# Patient Record
Sex: Female | Born: 1995 | Race: Black or African American | Hispanic: No | Marital: Single | Smoking: Never smoker
Health system: Southern US, Community
[De-identification: ages and names within clinical notes are randomized; demographics above are authoritative.]

---

## 2014-05-05 ENCOUNTER — Emergency Department (HOSPITAL_BASED_OUTPATIENT_CLINIC_OR_DEPARTMENT_OTHER): Payer: 59

## 2014-05-05 ENCOUNTER — Encounter (HOSPITAL_BASED_OUTPATIENT_CLINIC_OR_DEPARTMENT_OTHER): Payer: Self-pay | Admitting: Emergency Medicine

## 2014-05-05 ENCOUNTER — Emergency Department (HOSPITAL_BASED_OUTPATIENT_CLINIC_OR_DEPARTMENT_OTHER)
Admission: EM | Admit: 2014-05-05 | Discharge: 2014-05-06 | Disposition: A | Discharge status: Home / Self Care | Payer: 59 | Attending: Emergency Medicine | Admitting: Emergency Medicine

## 2014-05-05 DIAGNOSIS — K1370 Unspecified lesions of oral mucosa: Secondary | ICD-10-CM | POA: Diagnosis present

## 2014-05-05 DIAGNOSIS — J029 Acute pharyngitis, unspecified: Secondary | ICD-10-CM | POA: Insufficient documentation

## 2014-05-05 DIAGNOSIS — J069 Acute upper respiratory infection, unspecified: Secondary | ICD-10-CM | POA: Insufficient documentation

## 2014-05-05 LAB — RAPID STREP SCREEN (MED CTR MEBANE ONLY): Streptococcus, Group A Screen (Direct): NEGATIVE

## 2014-05-05 NOTE — ED Notes (Signed)
Pt presets to ED with complaints of throat pain and swelling for 2 days. NAD

## 2014-05-06 ENCOUNTER — Encounter (HOSPITAL_BASED_OUTPATIENT_CLINIC_OR_DEPARTMENT_OTHER): Payer: Self-pay | Admitting: Emergency Medicine

## 2014-05-06 MED ORDER — FLUTICASONE PROPIONATE 50 MCG/ACT NA SUSP: 2.0000 | Freq: Every day | NASAL | Status: AC

## 2014-05-06 MED ORDER — LIDOCAINE VISCOUS 2 % MT SOLN
15.0000 mL | Freq: Once | OROMUCOSAL | Status: AC
  Administered 2014-05-06: 15 mL via OROMUCOSAL
  Filled 2014-05-06: qty 15

## 2014-05-06 MED ORDER — IBUPROFEN 400 MG PO TABS
400.0000 mg | ORAL_TABLET | Freq: Once | ORAL | Status: AC
  Administered 2014-05-06: 400 mg via ORAL
  Filled 2014-05-06: qty 1

## 2014-05-06 MED ORDER — NAPROXEN 375 MG PO TABS: 375.0000 mg | ORAL_TABLET | Freq: Two times a day (BID) | ORAL | Status: AC

## 2014-05-06 MED ORDER — LORATADINE 10 MG PO TABS
10.0000 mg | ORAL_TABLET | Freq: Once | ORAL | Status: AC
  Administered 2014-05-06: 10 mg via ORAL
  Filled 2014-05-06: qty 1

## 2014-05-06 NOTE — Discharge Instructions (Signed)
Cool Mist Vaporizers °Vaporizers may help relieve the symptoms of a cough and cold. They add moisture to the air, which helps mucus to become thinner and less sticky. This makes it easier to breathe and cough up secretions. Cool mist vaporizers do not cause serious burns like hot mist vaporizers, which may also be called steamers or humidifiers. Vaporizers have not been proven to help with colds. You should not use a vaporizer if you are allergic to mold. °HOME CARE INSTRUCTIONS °· Follow the package instructions for the vaporizer. °· Do not use anything other than distilled water in the vaporizer. °· Do not run the vaporizer all of the time. This can cause mold or bacteria to grow in the vaporizer. °· Clean the vaporizer after each time it is used. °· Clean and dry the vaporizer well before storing it. °· Stop using the vaporizer if worsening respiratory symptoms develop. °Document Released: 04/27/2004 Document Revised: 08/05/2013 Document Reviewed: 12/18/2012 °[employer]® Patient Information ©2015 [employer]. This information is not intended to replace advice given to you by your health care provider. Make sure you discuss any questions you have with your health care provider. ° °

## 2014-05-06 NOTE — ED Provider Notes (Signed)
CSN: [id number]     Arrival date & time 05/05/14  2113 History   First MD Initiated Contact with Patient 05/05/14 2301     Chief Complaint  Patient presents with  . Oral Swelling     (Consider location/radiation/quality/duration/timing/severity/associated sxs/prior Treatment) Patient is a 18 y.o. female presenting with pharyngitis. The history is provided by the patient.  Sore Throat This is a new problem. The current episode started yesterday. The problem occurs constantly. The problem has not changed since onset.Pertinent negatives include no chest pain, no abdominal pain, no headaches and no shortness of breath. Nothing aggravates the symptoms. Nothing relieves the symptoms. She has tried nothing for the symptoms. The treatment provided no relief.    History reviewed. No pertinent past medical history. History reviewed. No pertinent past surgical history. No family history on file. History  Substance Use Topics  . Smoking status: Never Smoker   . Smokeless tobacco: Not on file  . Alcohol Use: Not on file   OB History   Grav Para Term Preterm Abortions TAB SAB Ect Mult Living                 Review of Systems  Constitutional: Negative for fever.  HENT: Positive for congestion and sore throat. Negative for trouble swallowing and voice change.   Respiratory: Negative for shortness of breath.   Cardiovascular: Negative for chest pain.  Gastrointestinal: Negative for abdominal pain.  Musculoskeletal: Negative for neck pain and neck stiffness.  Neurological: Negative for headaches.  All other systems reviewed and are negative.     Allergies  Shellfish allergy  Home Medications   Prior to Admission medications   Not on File   BP 124/82  Pulse 80  Temp(Src) 99 F (37.2 C) (Oral)  Resp 18  Ht  (1.778 m)  Wt 135 lb (61.236 kg)  BMI 19.37 kg/m2  SpO2 100%  LMP 04/10/2014 Physical Exam  Constitutional: She is oriented to person, place, and time. She appears  well-developed and well-nourished. No distress.  HENT:  Head: Normocephalic and atraumatic.  Mouth/Throat: Oropharynx is clear and moist. No oropharyngeal exudate.  Eyes: Conjunctivae and EOM are normal. Pupils are equal, round, and reactive to light.  Neck: Normal range of motion. Neck supple. No tracheal deviation present.  No pain with displacement of the trachea  Cardiovascular: Normal rate, regular rhythm and intact distal pulses.   Pulmonary/Chest: Effort normal and breath sounds normal. No stridor. No respiratory distress. She has no wheezes. She has no rales.  Abdominal: Soft. Bowel sounds are normal. There is no tenderness. There is no rebound and no guarding.  Musculoskeletal: Normal range of motion.  Lymphadenopathy:    She has no cervical adenopathy.  Neurological: She is alert and oriented to person, place, and time.  Skin: Skin is warm and dry.  Psychiatric: She has a normal mood and affect.    ED Course  Procedures (including critical care time) Labs Review Labs Reviewed  RAPID STREP SCREEN  CULTURE, GROUP A STREP    Imaging Review Dg Neck Soft Tissue  05/06/2014   CLINICAL DATA:  Sore throat and difficulty swallowing  EXAM: NECK SOFT TISSUES - 1+ VIEW  COMPARISON:  None.  FINDINGS: There is no evidence of retropharyngeal soft tissue swelling or epiglottic enlargement. The cervical airway is unremarkable and no radio-opaque foreign body identified.  IMPRESSION: Negative.   Electronically Signed   By: Tiburcio Pea M.D.   On: 05/06/2014 00:00     EKG  Interpretation None      MDM   Final diagnoses:  None    Viral pharyngitis no swelling noted on exam or Xrays will treat symptomatically.Return for swelling of the lips or tongue or throat, difficulty breathing inability to swallow change of voice or any concerns follow up with your doctor in 2 days for recheck    Kazaria Gaertner K Shahida Schnackenberg-Rasch, MD 05/06/14 0454

## 2014-05-07 LAB — CULTURE, GROUP A STREP

## 2015-02-08 IMAGING — CR DG NECK SOFT TISSUE
2 series · 2 of 2 positions shown · non-contrast
Comparison: None.

CLINICAL DATA: Sore throat and difficulty swallowing

EXAM:
NECK SOFT TISSUES - 1+ VIEW

[w soft tissue neck]
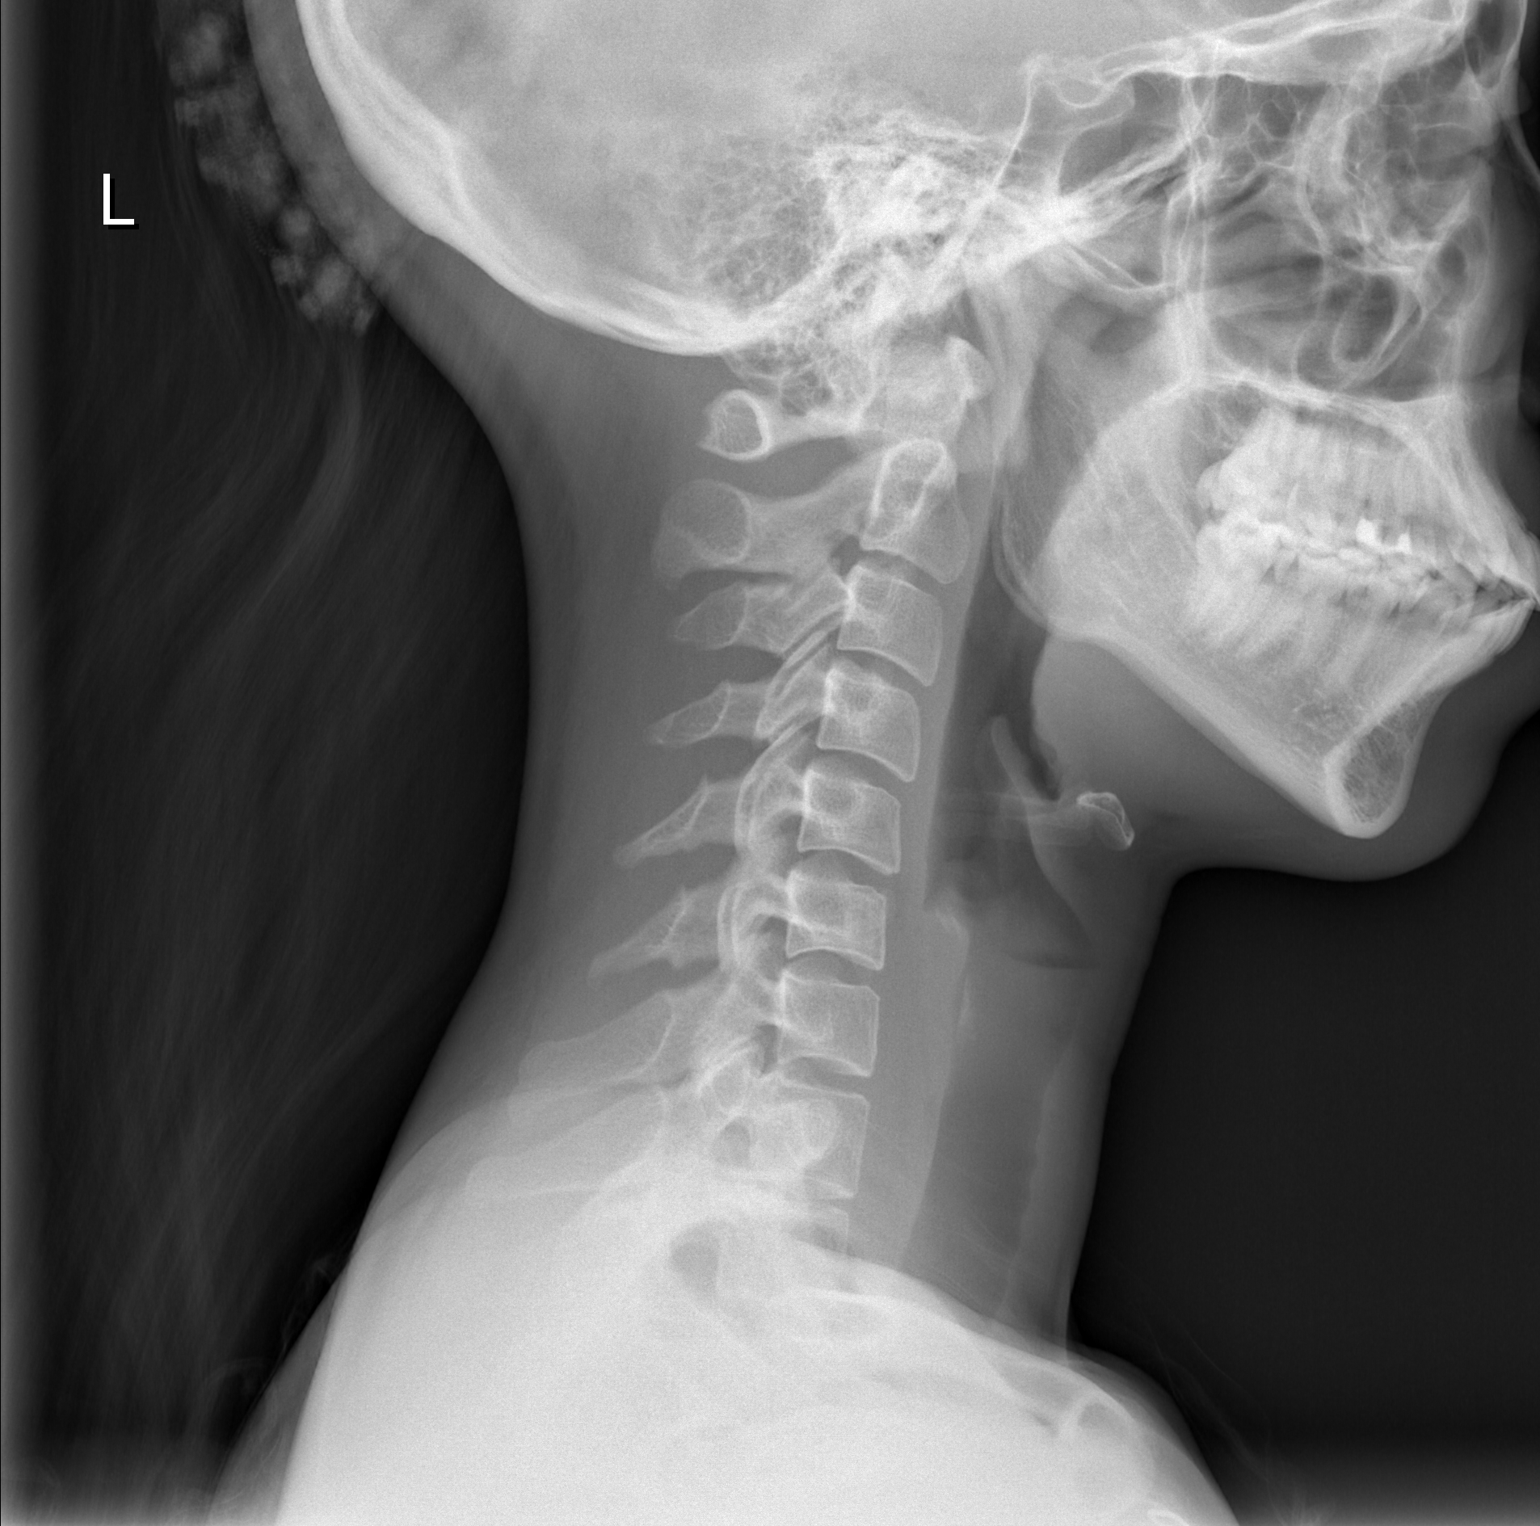

[w soft tissue neck ap]
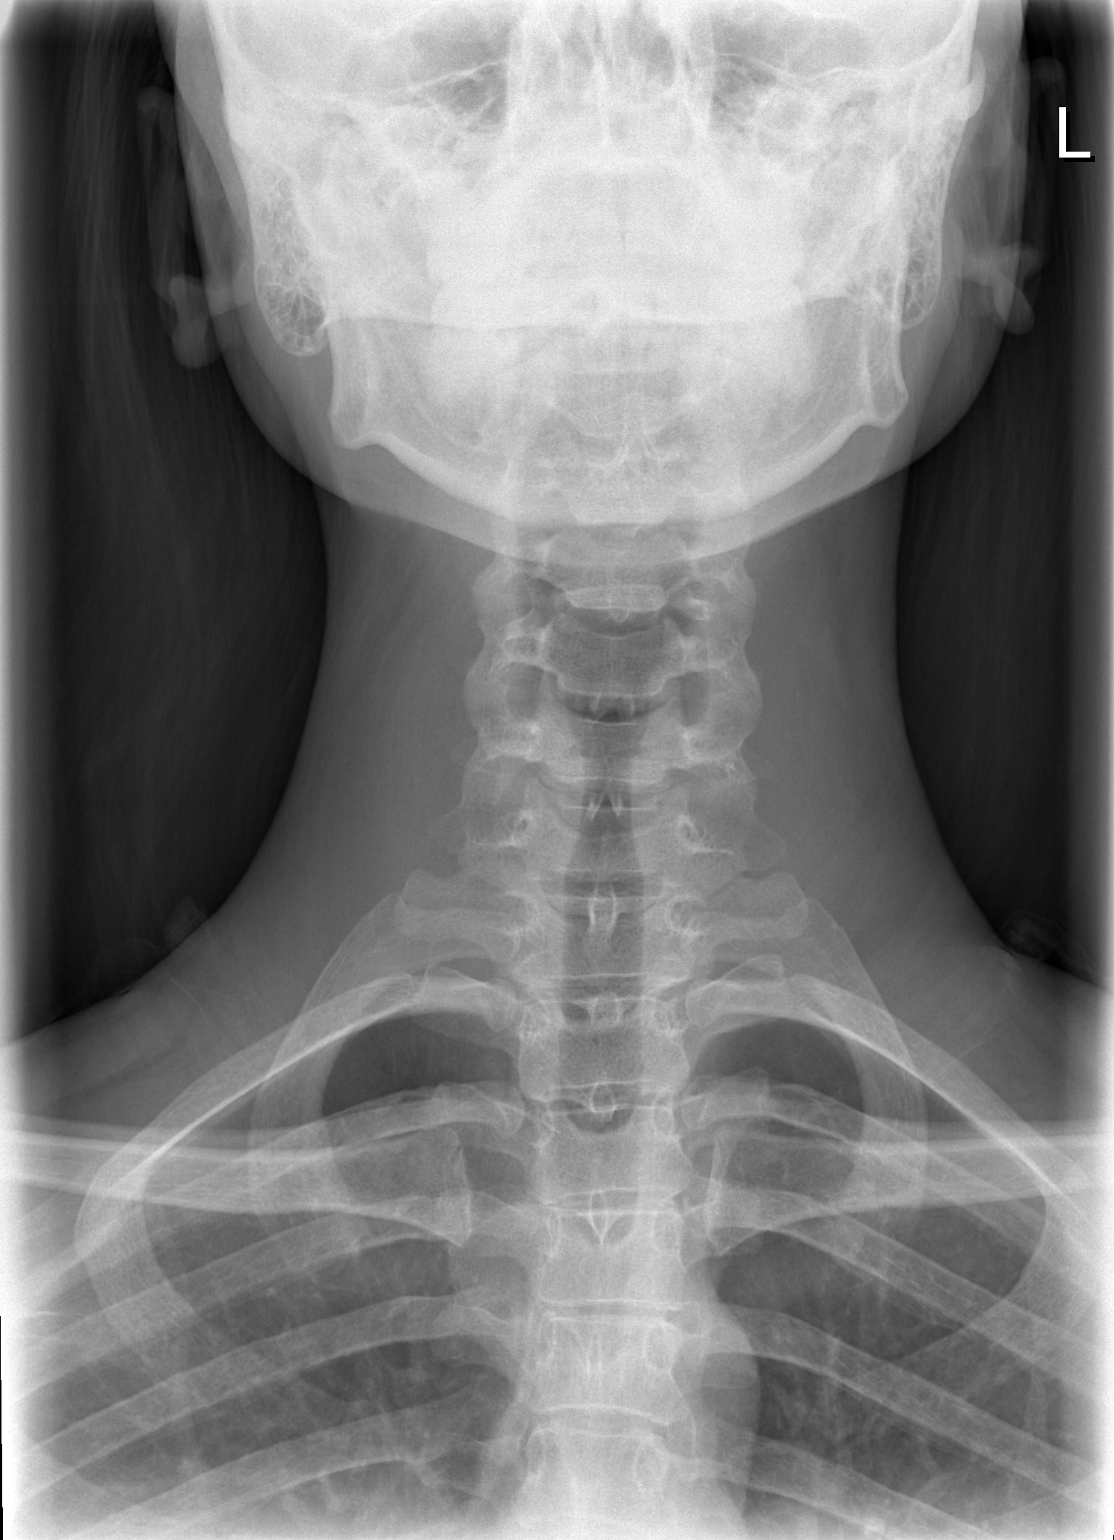

[2 of 2 positions shown; findings below may reference images not displayed]

FINDINGS: There is no evidence of retropharyngeal soft tissue swelling or
epiglottic enlargement. The cervical airway is unremarkable and no
radio-opaque foreign body identified.
IMPRESSION: Negative.
